# Patient Record
Sex: Male | Born: 1980 | Race: White | Hispanic: No | Marital: Married | Smoking: Heavy tobacco smoker
Health system: Southern US, Community
[De-identification: ages and names within clinical notes are randomized; demographics above are authoritative.]

## PROBLEM LIST (undated history)

## (undated) HISTORY — PX: WISDOM TOOTH EXTRACTION: SHX21

---

## 2003-01-11 ENCOUNTER — Emergency Department (HOSPITAL_COMMUNITY): Admission: EM | Admit: 2003-01-11 | Discharge: 2003-01-11 | Payer: Self-pay | Admitting: Emergency Medicine

## 2007-05-31 ENCOUNTER — Emergency Department (HOSPITAL_COMMUNITY): Admission: EM | Admit: 2007-05-31 | Discharge: 2007-05-31 | Payer: Self-pay | Admitting: Emergency Medicine

## 2018-02-05 DIAGNOSIS — R0683 Snoring: Secondary | ICD-10-CM | POA: Diagnosis not present

## 2018-02-05 DIAGNOSIS — Z1389 Encounter for screening for other disorder: Secondary | ICD-10-CM | POA: Diagnosis not present

## 2018-02-05 DIAGNOSIS — Z6821 Body mass index (BMI) 21.0-21.9, adult: Secondary | ICD-10-CM | POA: Diagnosis not present

## 2018-02-14 DIAGNOSIS — R0683 Snoring: Secondary | ICD-10-CM | POA: Diagnosis not present

## 2018-03-02 DIAGNOSIS — G4730 Sleep apnea, unspecified: Secondary | ICD-10-CM | POA: Diagnosis not present

## 2018-03-12 DIAGNOSIS — G4733 Obstructive sleep apnea (adult) (pediatric): Secondary | ICD-10-CM | POA: Diagnosis not present

## 2018-03-21 ENCOUNTER — Encounter (HOSPITAL_COMMUNITY): Payer: Self-pay | Admitting: Emergency Medicine

## 2018-03-21 ENCOUNTER — Emergency Department (HOSPITAL_COMMUNITY)
Admission: EM | Admit: 2018-03-21 | Discharge: 2018-03-21 | Disposition: A | Discharge status: Home / Self Care | Payer: BLUE CROSS/BLUE SHIELD | Attending: Emergency Medicine | Admitting: Emergency Medicine

## 2018-03-21 ENCOUNTER — Other Ambulatory Visit: Payer: Self-pay

## 2018-03-21 DIAGNOSIS — F17200 Nicotine dependence, unspecified, uncomplicated: Secondary | ICD-10-CM | POA: Diagnosis not present

## 2018-03-21 DIAGNOSIS — R112 Nausea with vomiting, unspecified: Secondary | ICD-10-CM | POA: Diagnosis not present

## 2018-03-21 NOTE — Discharge Instructions (Signed)
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  Current symptoms not consistent with any concern for coronavirus.  Patient without any symptoms consistent with an upper respiratory illness.
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
## 2018-03-21 NOTE — ED Provider Notes (Signed)
[facility] EMERGENCY DEPARTMENT Provider Note   CSN: [id number] Arrival date & time: 03/21/18  1420    History   Chief Complaint Chief Complaint  Patient presents with  . Emesis    HPI Cole Carter is a 38 y.o. male.     Patient works the overnight shift at [employer].  He states he got overheated had episode of vomiting.  Did continue after he left work last episode of vomiting was at 12 noon today.  Patient states he feels fatigued.  Had no diarrhea.  Patient has no fever or any upper respiratory symptoms.  Patient with no known exposure to coronavirus.  Patient without any travel to outside [place] or to any states within the night states recently with increased activity of coronavirus.     History reviewed. No pertinent past medical history.  There are no active problems to display for this patient.   History reviewed. No pertinent surgical history.      Home Medications    Prior to Admission medications   Not on File    Family History No family history on file.  Social History Social History   Tobacco Use  . Smoking status: Light Tobacco Smoker  . Smokeless tobacco: Never Used  Substance Use Topics  . Alcohol use: Yes    Comment: socially   . Drug use: Never     Allergies   Cephalexin   Review of Systems Review of Systems  Constitutional: Positive for fatigue. Negative for chills and fever.  HENT: Negative for congestion, rhinorrhea and sore throat.   Eyes: Negative for visual disturbance.  Respiratory: Negative for cough and shortness of breath.   Cardiovascular: Negative for chest pain and leg swelling.  Gastrointestinal: Positive for nausea and vomiting. Negative for abdominal pain and diarrhea.  Genitourinary: Negative for dysuria.  Musculoskeletal: Negative for back pain and neck pain.  Skin: Negative for rash.  Neurological: Negative for dizziness, light-headedness and headaches.  Hematological: Does not bruise/bleed  easily.  Psychiatric/Behavioral: Negative for confusion.     Physical Exam Updated Vital Signs BP (!) 139/93 (BP Location: Right Arm)   Pulse 80   Temp 98.3 F (36.8 C) (Oral)   Resp 18   Ht 1.93 m (6\' 4" )   Wt 88 kg   SpO2 98%   BMI 23.61 kg/m   Physical Exam Vitals signs and nursing note reviewed.  Constitutional:      Appearance: He is well-developed.  HENT:     Head: Normocephalic and atraumatic.     Nose: No congestion.     Mouth/Throat:     Mouth: Mucous membranes are dry.     Pharynx: No oropharyngeal exudate or posterior oropharyngeal erythema.     Comments: Because membranes slightly dry Eyes:     Extraocular Movements: Extraocular movements intact.     Conjunctiva/sclera: Conjunctivae normal.     Pupils: Pupils are equal, round, and reactive to light.  Neck:     Musculoskeletal: Normal range of motion and neck supple. No neck rigidity.  Cardiovascular:     Rate and Rhythm: Normal rate and regular rhythm.     Heart sounds: Normal heart sounds. No murmur.  Pulmonary:     Effort: Pulmonary effort is normal. No respiratory distress.     Breath sounds: Normal breath sounds.  Abdominal:     General: Abdomen is flat. Bowel sounds are normal.     Palpations: Abdomen is soft.     Tenderness: There is no abdominal  tenderness.  Musculoskeletal: Normal range of motion.  Skin:    General: Skin is warm and dry.     Findings: No rash.  Neurological:     General: No focal deficit present.     Mental Status: He is alert and oriented to person, place, and time.      ED Treatments / Results  Labs (all labs ordered are listed, but only abnormal results are displayed) Labs Reviewed - No data to display  EKG None  Radiology No results found.  Procedures Procedures (including critical care time)  Medications Ordered in ED Medications - No data to display   Initial Impression / Assessment and Plan / ED Course  I have reviewed the triage vital signs and the  nursing notes.  Pertinent labs & imaging results that were available during my care of the patient were reviewed by me and considered in my medical decision making (see chart for details).        Patient was at work overnight yesterday and today.  Feels he got overheated in a hot room and had vomiting.  Is gone home last episode of vomiting was around 12 noon today.  Patient still feels somewhat fatigued.  No diarrhea no upper respiratory symptoms at all nothing really consistent with onset of coronavirus.  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  If feeling well can return to work tomorrow.  Not clear from discussing with the patient but it may be possible that patient may need to be evaluated tomorrow to have a note to actually return to work.  Cannot provide him that at this time since he does not feel well enough to return to work tonight.  Patient works the overnight shift.  Final Clinical Impressions(s) / ED Diagnoses   Final diagnoses:  Non-intractable vomiting with nausea, unspecified vomiting type    ED Discharge Orders    None       Vanetta Mulders, MD 03/21/18 [phone]

## 2018-06-13 ENCOUNTER — Other Ambulatory Visit: Payer: Self-pay

## 2018-06-13 ENCOUNTER — Other Ambulatory Visit (HOSPITAL_COMMUNITY): Payer: Self-pay | Admitting: Physician Assistant

## 2018-06-13 ENCOUNTER — Ambulatory Visit (HOSPITAL_COMMUNITY)
Admission: RE | Admit: 2018-06-13 | Discharge: 2018-06-13 | Disposition: A | Discharge status: Home / Self Care | Payer: BC Managed Care – PPO | Source: Ambulatory Visit | Attending: Physician Assistant | Admitting: Physician Assistant

## 2018-06-13 DIAGNOSIS — K2970 Gastritis, unspecified, without bleeding: Secondary | ICD-10-CM | POA: Insufficient documentation

## 2018-06-13 DIAGNOSIS — R0602 Shortness of breath: Secondary | ICD-10-CM | POA: Diagnosis not present

## 2018-06-13 DIAGNOSIS — R1310 Dysphagia, unspecified: Secondary | ICD-10-CM | POA: Diagnosis not present

## 2018-06-13 DIAGNOSIS — Z1389 Encounter for screening for other disorder: Secondary | ICD-10-CM | POA: Diagnosis not present

## 2018-06-13 DIAGNOSIS — K209 Esophagitis, unspecified without bleeding: Secondary | ICD-10-CM

## 2018-06-13 DIAGNOSIS — R12 Heartburn: Secondary | ICD-10-CM

## 2018-06-13 DIAGNOSIS — Z6821 Body mass index (BMI) 21.0-21.9, adult: Secondary | ICD-10-CM | POA: Diagnosis not present

## 2018-06-13 DIAGNOSIS — R079 Chest pain, unspecified: Secondary | ICD-10-CM | POA: Diagnosis not present

## 2018-06-14 ENCOUNTER — Ambulatory Visit: Payer: Self-pay | Admitting: Gastroenterology

## 2018-06-14 ENCOUNTER — Encounter: Payer: Self-pay | Admitting: Gastroenterology

## 2018-06-18 ENCOUNTER — Other Ambulatory Visit: Payer: Self-pay | Admitting: Physician Assistant

## 2018-06-18 DIAGNOSIS — R7989 Other specified abnormal findings of blood chemistry: Secondary | ICD-10-CM

## 2018-06-26 ENCOUNTER — Ambulatory Visit (HOSPITAL_COMMUNITY)
Admission: RE | Admit: 2018-06-26 | Discharge: 2018-06-26 | Disposition: A | Discharge status: Home / Self Care | Payer: BC Managed Care – PPO | Source: Ambulatory Visit | Attending: Physician Assistant | Admitting: Physician Assistant

## 2018-06-26 ENCOUNTER — Other Ambulatory Visit (HOSPITAL_COMMUNITY): Payer: Self-pay | Admitting: Physician Assistant

## 2018-06-26 ENCOUNTER — Other Ambulatory Visit: Payer: Self-pay

## 2018-06-26 DIAGNOSIS — R7989 Other specified abnormal findings of blood chemistry: Secondary | ICD-10-CM | POA: Diagnosis not present

## 2018-06-26 LAB — POCT I-STAT CREATININE: Creatinine, Ser: 1.2 mg/dL (ref 0.61–1.24)

## 2018-06-26 MED ORDER — IOHEXOL 350 MG/ML SOLN
100.0000 mL | Freq: Once | INTRAVENOUS | Status: AC | PRN
  Administered 2018-06-26: 19:00:00 100 mL via INTRAVENOUS

## 2018-06-29 ENCOUNTER — Ambulatory Visit (HOSPITAL_COMMUNITY): Payer: Self-pay

## 2018-07-09 ENCOUNTER — Other Ambulatory Visit: Payer: Self-pay

## 2018-07-09 ENCOUNTER — Other Ambulatory Visit: Payer: BC Managed Care – PPO

## 2018-07-09 DIAGNOSIS — Z20822 Contact with and (suspected) exposure to covid-19: Secondary | ICD-10-CM

## 2018-07-14 LAB — NOVEL CORONAVIRUS, NAA: SARS-CoV-2, NAA: NOT DETECTED

## 2018-07-24 DIAGNOSIS — R070 Pain in throat: Secondary | ICD-10-CM | POA: Diagnosis not present

## 2018-07-24 DIAGNOSIS — J029 Acute pharyngitis, unspecified: Secondary | ICD-10-CM | POA: Diagnosis not present

## 2018-07-24 DIAGNOSIS — Z6821 Body mass index (BMI) 21.0-21.9, adult: Secondary | ICD-10-CM | POA: Diagnosis not present

## 2018-07-24 DIAGNOSIS — Z1389 Encounter for screening for other disorder: Secondary | ICD-10-CM | POA: Diagnosis not present

## 2018-08-01 ENCOUNTER — Other Ambulatory Visit: Payer: Self-pay

## 2018-08-01 ENCOUNTER — Other Ambulatory Visit: Payer: BC Managed Care – PPO

## 2018-08-01 DIAGNOSIS — Z20822 Contact with and (suspected) exposure to covid-19: Secondary | ICD-10-CM

## 2018-08-01 DIAGNOSIS — Z2089 Contact with and (suspected) exposure to other communicable diseases: Secondary | ICD-10-CM | POA: Diagnosis not present

## 2018-08-01 DIAGNOSIS — Z6821 Body mass index (BMI) 21.0-21.9, adult: Secondary | ICD-10-CM | POA: Diagnosis not present

## 2018-08-01 DIAGNOSIS — R509 Fever, unspecified: Secondary | ICD-10-CM | POA: Diagnosis not present

## 2018-08-02 LAB — NOVEL CORONAVIRUS, NAA: SARS-CoV-2, NAA: NOT DETECTED

## 2018-08-03 ENCOUNTER — Telehealth: Payer: Self-pay | Admitting: General Practice

## 2018-08-03 NOTE — Telephone Encounter (Signed)
Pt made aware of negative results 08/03/2018, faxed copy to employer  Fax: [phone] to the attention of Gailen Shelter

## 2018-08-03 NOTE — Telephone Encounter (Signed)
Fax confirmation received. 

## 2018-08-27 ENCOUNTER — Other Ambulatory Visit: Payer: Self-pay

## 2018-08-27 DIAGNOSIS — L409 Psoriasis, unspecified: Secondary | ICD-10-CM | POA: Diagnosis not present

## 2018-08-27 DIAGNOSIS — Z20822 Contact with and (suspected) exposure to covid-19: Secondary | ICD-10-CM

## 2018-08-27 DIAGNOSIS — Z139 Encounter for screening, unspecified: Secondary | ICD-10-CM | POA: Diagnosis not present

## 2018-08-28 LAB — NOVEL CORONAVIRUS, NAA: SARS-CoV-2, NAA: NOT DETECTED

## 2018-10-31 ENCOUNTER — Other Ambulatory Visit: Payer: Self-pay | Admitting: *Deleted

## 2018-10-31 DIAGNOSIS — Z20822 Contact with and (suspected) exposure to covid-19: Secondary | ICD-10-CM

## 2018-11-01 LAB — NOVEL CORONAVIRUS, NAA: SARS-CoV-2, NAA: NOT DETECTED

## 2018-11-02 ENCOUNTER — Telehealth: Payer: Self-pay | Admitting: Physician Assistant

## 2018-11-02 NOTE — Telephone Encounter (Signed)
Negative COVID results given. Patient results "NOT Detected." Caller expressed understanding. ° °

## 2019-04-03 ENCOUNTER — Ambulatory Visit (HOSPITAL_COMMUNITY)
Admission: EM | Admit: 2019-04-03 | Discharge: 2019-04-03 | Payer: Self-pay | Attending: Plastic Surgery | Admitting: Plastic Surgery

## 2019-04-03 ENCOUNTER — Emergency Department (HOSPITAL_COMMUNITY): Payer: Self-pay | Admitting: Certified Registered Nurse Anesthetist

## 2019-04-03 ENCOUNTER — Encounter (HOSPITAL_COMMUNITY): Admission: EM | Payer: Self-pay | Source: Home / Self Care | Attending: Emergency Medicine

## 2019-04-03 ENCOUNTER — Emergency Department (HOSPITAL_COMMUNITY): Payer: Self-pay

## 2019-04-03 ENCOUNTER — Other Ambulatory Visit: Payer: Self-pay

## 2019-04-03 ENCOUNTER — Encounter (HOSPITAL_COMMUNITY): Payer: Self-pay

## 2019-04-03 DIAGNOSIS — W3400XA Accidental discharge from unspecified firearms or gun, initial encounter: Secondary | ICD-10-CM

## 2019-04-03 DIAGNOSIS — S68110A Complete traumatic metacarpophalangeal amputation of right index finger, initial encounter: Secondary | ICD-10-CM

## 2019-04-03 DIAGNOSIS — S68620A Partial traumatic transphalangeal amputation of right index finger, initial encounter: Secondary | ICD-10-CM | POA: Insufficient documentation

## 2019-04-03 DIAGNOSIS — F17200 Nicotine dependence, unspecified, uncomplicated: Secondary | ICD-10-CM | POA: Insufficient documentation

## 2019-04-03 DIAGNOSIS — Y939 Activity, unspecified: Secondary | ICD-10-CM | POA: Insufficient documentation

## 2019-04-03 DIAGNOSIS — Z20822 Contact with and (suspected) exposure to covid-19: Secondary | ICD-10-CM | POA: Insufficient documentation

## 2019-04-03 HISTORY — PX: I & D EXTREMITY: SHX5045

## 2019-04-03 HISTORY — PX: DEBRIDEMENT AND CLOSURE WOUND: SHX5614

## 2019-04-03 LAB — CBC WITH DIFFERENTIAL/PLATELET
Abs Immature Granulocytes: 0.02 10*3/uL (ref 0.00–0.07)
Basophils Absolute: 0 10*3/uL (ref 0.0–0.1)
Basophils Relative: 1 %
Eosinophils Absolute: 0 10*3/uL (ref 0.0–0.5)
Eosinophils Relative: 0 %
HCT: 46.9 % (ref 39.0–52.0)
Hemoglobin: 15.5 g/dL (ref 13.0–17.0)
Immature Granulocytes: 0 %
Lymphocytes Relative: 20 %
Lymphs Abs: 1.3 10*3/uL (ref 0.7–4.0)
MCH: 30.6 pg (ref 26.0–34.0)
MCHC: 33 g/dL (ref 30.0–36.0)
MCV: 92.5 fL (ref 80.0–100.0)
Monocytes Absolute: 0.4 10*3/uL (ref 0.1–1.0)
Monocytes Relative: 7 %
Neutro Abs: 4.5 10*3/uL (ref 1.7–7.7)
Neutrophils Relative %: 72 %
Platelets: 226 10*3/uL (ref 150–400)
RBC: 5.07 MIL/uL (ref 4.22–5.81)
RDW: 12.6 % (ref 11.5–15.5)
WBC: 6.2 10*3/uL (ref 4.0–10.5)
nRBC: 0 % (ref 0.0–0.2)

## 2019-04-03 LAB — BASIC METABOLIC PANEL
Anion gap: 11 (ref 5–15)
BUN: 9 mg/dL (ref 6–20)
CO2: 27 mmol/L (ref 22–32)
Calcium: 9.1 mg/dL (ref 8.9–10.3)
Chloride: 105 mmol/L (ref 98–111)
Creatinine, Ser: 1.32 mg/dL — ABNORMAL HIGH (ref 0.61–1.24)
GFR calc Af Amer: >60 mL/min (ref >60)
GFR calc non Af Amer: >60 mL/min (ref >60)
Glucose, Bld: 111 mg/dL — ABNORMAL HIGH (ref 70–99)
Potassium: 4.3 mmol/L (ref 3.5–5.1)
Sodium: 143 mmol/L (ref 135–145)

## 2019-04-03 LAB — RESPIRATORY PANEL BY RT PCR (FLU A&B, COVID)
Influenza A by PCR: NEGATIVE
Influenza B by PCR: NEGATIVE
SARS Coronavirus 2 by RT PCR: NEGATIVE

## 2019-04-03 SURGERY — IRRIGATION AND DEBRIDEMENT EXTREMITY
Anesthesia: General | Site: Hand | Laterality: Right

## 2019-04-03 MED ORDER — ONDANSETRON HCL 4 MG/2ML IJ SOLN: 4.0000 mg | Freq: Once | INTRAMUSCULAR | Status: DC | PRN

## 2019-04-03 MED ORDER — PROPOFOL 10 MG/ML IV BOLUS
INTRAVENOUS | Status: DC | PRN
  Administered 2019-04-03: 100 mg via INTRAVENOUS

## 2019-04-03 MED ORDER — LIDOCAINE-EPINEPHRINE 1 %-1:100000 IJ SOLN
INTRAMUSCULAR | Status: AC
  Filled 2019-04-03: qty 1

## 2019-04-03 MED ORDER — OXYCODONE HCL 5 MG/5ML PO SOLN: 5.0000 mg | Freq: Once | ORAL | Status: DC | PRN

## 2019-04-03 MED ORDER — PHENYLEPHRINE 40 MCG/ML (10ML) SYRINGE FOR IV PUSH (FOR BLOOD PRESSURE SUPPORT)
PREFILLED_SYRINGE | INTRAVENOUS | Status: AC
  Filled 2019-04-03: qty 10

## 2019-04-03 MED ORDER — CEFAZOLIN SODIUM-DEXTROSE 2-4 GM/100ML-% IV SOLN
2.0000 g | INTRAVENOUS | Status: AC
  Administered 2019-04-03: 2 g via INTRAVENOUS
  Filled 2019-04-03: qty 100

## 2019-04-03 MED ORDER — LIDOCAINE 2% (20 MG/ML) 5 ML SYRINGE
INTRAMUSCULAR | Status: AC
  Filled 2019-04-03: qty 10

## 2019-04-03 MED ORDER — HYDROCODONE-ACETAMINOPHEN 5-325 MG PO TABS: 1.0000 | ORAL_TABLET | Freq: Four times a day (QID) | ORAL | 0 refills | Status: AC | PRN

## 2019-04-03 MED ORDER — FENTANYL CITRATE (PF) 250 MCG/5ML IJ SOLN
INTRAMUSCULAR | Status: AC
  Filled 2019-04-03: qty 5

## 2019-04-03 MED ORDER — LACTATED RINGERS IV SOLN: INTRAVENOUS | Status: DC

## 2019-04-03 MED ORDER — OXYCODONE HCL 5 MG PO TABS: 5.0000 mg | ORAL_TABLET | Freq: Once | ORAL | Status: DC | PRN

## 2019-04-03 MED ORDER — BUPIVACAINE HCL (PF) 0.5 % IJ SOLN
10.0000 mL | Freq: Once | INTRAMUSCULAR | Status: AC
  Administered 2019-04-03: 10 mL
  Filled 2019-04-03: qty 10

## 2019-04-03 MED ORDER — DEXAMETHASONE SODIUM PHOSPHATE 10 MG/ML IJ SOLN
INTRAMUSCULAR | Status: AC
  Filled 2019-04-03: qty 2

## 2019-04-03 MED ORDER — PHENYLEPHRINE 40 MCG/ML (10ML) SYRINGE FOR IV PUSH (FOR BLOOD PRESSURE SUPPORT)
PREFILLED_SYRINGE | INTRAVENOUS | Status: DC | PRN
  Administered 2019-04-03: 80 ug via INTRAVENOUS

## 2019-04-03 MED ORDER — PROPOFOL 10 MG/ML IV BOLUS
INTRAVENOUS | Status: AC
  Filled 2019-04-03: qty 20

## 2019-04-03 MED ORDER — FENTANYL CITRATE (PF) 100 MCG/2ML IJ SOLN: 25.0000 ug | INTRAMUSCULAR | Status: DC | PRN

## 2019-04-03 MED ORDER — CHLORHEXIDINE GLUCONATE 4 % EX LIQD
60.0000 mL | Freq: Once | CUTANEOUS | Status: DC
  Filled 2019-04-03: qty 60

## 2019-04-03 MED ORDER — MIDAZOLAM HCL 2 MG/2ML IJ SOLN
INTRAMUSCULAR | Status: DC | PRN
  Administered 2019-04-03: 2 mg via INTRAVENOUS

## 2019-04-03 MED ORDER — DEXMEDETOMIDINE HCL IN NACL 200 MCG/50ML IV SOLN
INTRAVENOUS | Status: DC | PRN
  Administered 2019-04-03 (×2): 20 ug via INTRAVENOUS

## 2019-04-03 MED ORDER — MIDAZOLAM HCL 2 MG/2ML IJ SOLN
INTRAMUSCULAR | Status: AC
  Filled 2019-04-03: qty 2

## 2019-04-03 MED ORDER — ACETAMINOPHEN 160 MG/5ML PO SOLN: 325.0000 mg | ORAL | Status: DC | PRN

## 2019-04-03 MED ORDER — ACETAMINOPHEN 325 MG PO TABS: 325.0000 mg | ORAL_TABLET | ORAL | Status: DC | PRN

## 2019-04-03 MED ORDER — LIDOCAINE 2% (20 MG/ML) 5 ML SYRINGE
INTRAMUSCULAR | Status: DC | PRN
  Administered 2019-04-03: 100 mg via INTRAVENOUS

## 2019-04-03 MED ORDER — POVIDONE-IODINE 10 % EX SWAB: 2.0000 "application " | Freq: Once | CUTANEOUS | Status: DC

## 2019-04-03 MED ORDER — LIDOCAINE-EPINEPHRINE 1 %-1:100000 IJ SOLN
INTRAMUSCULAR | Status: DC | PRN
  Administered 2019-04-03: 10 mL

## 2019-04-03 MED ORDER — KETOROLAC TROMETHAMINE 30 MG/ML IJ SOLN: 30.0000 mg | Freq: Once | INTRAMUSCULAR | Status: DC | PRN

## 2019-04-03 MED ORDER — FENTANYL CITRATE (PF) 250 MCG/5ML IJ SOLN
INTRAMUSCULAR | Status: DC | PRN
  Administered 2019-04-03 (×2): 50 ug via INTRAVENOUS
  Administered 2019-04-03: 100 ug via INTRAVENOUS

## 2019-04-03 MED ORDER — MEPERIDINE HCL 25 MG/ML IJ SOLN: 6.2500 mg | INTRAMUSCULAR | Status: DC | PRN

## 2019-04-03 MED ORDER — ONDANSETRON HCL 4 MG/2ML IJ SOLN
INTRAMUSCULAR | Status: DC | PRN
  Administered 2019-04-03: 4 mg via INTRAVENOUS

## 2019-04-03 MED ORDER — DEXAMETHASONE SODIUM PHOSPHATE 10 MG/ML IJ SOLN
INTRAMUSCULAR | Status: DC | PRN
  Administered 2019-04-03: 5 mg via INTRAVENOUS

## 2019-04-03 MED ORDER — SODIUM CHLORIDE 0.9 % IR SOLN
Status: DC | PRN
  Administered 2019-04-03: 3000 mL

## 2019-04-03 MED ORDER — SUCCINYLCHOLINE CHLORIDE 200 MG/10ML IV SOSY
PREFILLED_SYRINGE | INTRAVENOUS | Status: AC
  Filled 2019-04-03: qty 10

## 2019-04-03 MED ORDER — BUPIVACAINE HCL (PF) 0.25 % IJ SOLN
INTRAMUSCULAR | Status: AC
  Filled 2019-04-03: qty 30

## 2019-04-03 MED ORDER — ROCURONIUM BROMIDE 10 MG/ML (PF) SYRINGE
PREFILLED_SYRINGE | INTRAVENOUS | Status: AC
  Filled 2019-04-03: qty 10

## 2019-04-03 MED ORDER — ONDANSETRON HCL 4 MG/2ML IJ SOLN
INTRAMUSCULAR | Status: AC
  Filled 2019-04-03: qty 4

## 2019-04-03 MED ORDER — CEFAZOLIN SODIUM-DEXTROSE 2-4 GM/100ML-% IV SOLN
2.0000 g | Freq: Once | INTRAVENOUS | Status: AC
  Administered 2019-04-03: 2 g via INTRAVENOUS
  Filled 2019-04-03: qty 100

## 2019-04-03 SURGICAL SUPPLY — 41 items
BNDG COHESIVE 1X5 TAN STRL LF (GAUZE/BANDAGES/DRESSINGS) ×3 IMPLANT
BNDG CONFORM 2 STRL LF (GAUZE/BANDAGES/DRESSINGS) IMPLANT
BNDG ELASTIC 4X5.8 VLCR STR LF (GAUZE/BANDAGES/DRESSINGS) ×3 IMPLANT
BNDG GAUZE ELAST 4 BULKY (GAUZE/BANDAGES/DRESSINGS) IMPLANT
CORD BIPOLAR FORCEPS 12FT (ELECTRODE) IMPLANT
COVER SURGICAL LIGHT HANDLE (MISCELLANEOUS) ×3 IMPLANT
COVER WAND RF STERILE (DRAPES) ×3 IMPLANT
CUFF TOURN SGL QUICK 18X4 (TOURNIQUET CUFF) IMPLANT
CUFF TOURN SGL QUICK 24 (TOURNIQUET CUFF)
CUFF TRNQT CYL 24X4X16.5-23 (TOURNIQUET CUFF) IMPLANT
DRAPE INCISE IOBAN 66X45 STRL (DRAPES) IMPLANT
DRSG ADAPTIC 3X8 NADH LF (GAUZE/BANDAGES/DRESSINGS) IMPLANT
GAUZE SPONGE 4X4 12PLY STRL (GAUZE/BANDAGES/DRESSINGS) ×3 IMPLANT
GAUZE XEROFORM 1X8 LF (GAUZE/BANDAGES/DRESSINGS) ×3 IMPLANT
GLOVE BIO SURGEON STRL SZ8 (GLOVE) ×3 IMPLANT
GLOVE BIOGEL M STRL SZ7.5 (GLOVE) ×3 IMPLANT
GOWN STRL REUS W/ TWL LRG LVL3 (GOWN DISPOSABLE) ×4 IMPLANT
GOWN STRL REUS W/TWL LRG LVL3 (GOWN DISPOSABLE) ×6
KIT BASIN OR (CUSTOM PROCEDURE TRAY) ×3 IMPLANT
KIT TURNOVER KIT B (KITS) ×3 IMPLANT
MANIFOLD NEPTUNE II (INSTRUMENTS) ×3 IMPLANT
NEEDLE HYPO 25GX1X1/2 BEV (NEEDLE) ×3 IMPLANT
NS IRRIG 1000ML POUR BTL (IV SOLUTION) ×3 IMPLANT
PACK ORTHO EXTREMITY (CUSTOM PROCEDURE TRAY) ×3 IMPLANT
PAD ABD 8X10 STRL (GAUZE/BANDAGES/DRESSINGS) IMPLANT
PAD ARMBOARD 7.5X6 YLW CONV (MISCELLANEOUS) ×3 IMPLANT
PAD CAST 4YDX4 CTTN HI CHSV (CAST SUPPLIES) IMPLANT
PADDING CAST COTTON 4X4 STRL (CAST SUPPLIES)
SET CYSTO W/LG BORE CLAMP LF (SET/KITS/TRAYS/PACK) IMPLANT
SOL PREP POV-IOD 4OZ 10% (MISCELLANEOUS) ×6 IMPLANT
SPONGE LAP 4X18 RFD (DISPOSABLE) ×3 IMPLANT
STAPLER VISISTAT 35W (STAPLE) IMPLANT
SUT CHROMIC 4 0 PS 2 18 (SUTURE) ×3 IMPLANT
SUT VIC AB 4-0 PS2 18 (SUTURE) IMPLANT
SWAB CULTURE ESWAB REG 1ML (MISCELLANEOUS) IMPLANT
SYR CONTROL 10ML LL (SYRINGE) ×3 IMPLANT
TOWEL GREEN STERILE (TOWEL DISPOSABLE) ×3 IMPLANT
TOWEL GREEN STERILE FF (TOWEL DISPOSABLE) ×3 IMPLANT
TUBE CONNECTING 12X1/4 (SUCTIONS) ×3 IMPLANT
WATER STERILE IRR 1000ML POUR (IV SOLUTION) ×3 IMPLANT
YANKAUER SUCT BULB TIP NO VENT (SUCTIONS) ×3 IMPLANT
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
## 2019-04-03 NOTE — ED Notes (Signed)
Security at bedside, chaplin present, social work present.

## 2019-04-03 NOTE — Anesthesia Preprocedure Evaluation (Addendum)
Anesthesia Evaluation  Patient identified by MRN, date of birth, ID band Patient awake    Reviewed: Allergy & Precautions, NPO status , Patient's Chart, lab work & pertinent test results  Airway Mallampati: I       Dental no notable dental hx. (+) Teeth Intact   Pulmonary neg pulmonary ROS, Current Smoker and Patient abstained from smoking.,    Pulmonary exam normal breath sounds clear to auscultation       Cardiovascular negative cardio ROS Normal cardiovascular exam Rhythm:Regular Rate:Normal     Neuro/Psych negative neurological ROS  negative psych ROS   GI/Hepatic negative GI ROS, Neg liver ROS,   Endo/Other  negative endocrine ROS  Renal/GU negative Renal ROS  negative genitourinary   Musculoskeletal negative musculoskeletal ROS (+)   Abdominal Normal abdominal exam  (+)   Peds negative pediatric ROS (+)  Hematology negative hematology ROS (+)   Anesthesia Other Findings   Reproductive/Obstetrics negative OB ROS                             Anesthesia Physical Anesthesia Plan  ASA: II  Anesthesia Plan: General   Post-op Pain Management:    Induction: Intravenous  PONV Risk Score and Plan: 1 and Ondansetron  Airway Management Planned: LMA  Additional Equipment: None  Intra-op Plan:   Post-operative Plan: Extubation in OR  Informed Consent: I have reviewed the patients History and Physical, chart, labs and discussed the procedure including the risks, benefits and alternatives for the proposed anesthesia with the patient or authorized representative who has indicated his/her understanding and acceptance.     Dental advisory given  Plan Discussed with: CRNA  Anesthesia Plan Comments:        Anesthesia Quick Evaluation

## 2019-04-03 NOTE — Discharge Instructions (Addendum)
Activity As tolerated:  NO driving No heavy activities No shower for 36 hours, you can shower on Friday 04/05/19 AM.  Diet: Regular  Wound Care: Keep dressing clean & dry Do not change dressings for 2 days, then change daily or as needed, whichever is sooner.  Special Instructions: Call Doctor if any unusual problems occur such as pain, excessive Bleeding, unrelieved Nausea/vomiting, Fever &/or chills  Follow-up appointment: Scheduled for 2 weeks.
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
## 2019-04-03 NOTE — Anesthesia Procedure Notes (Signed)
Procedure Name: LMA Insertion Date/Time: 04/03/2019 4:44 PM Performed by: Drema Pry, CRNA Pre-anesthesia Checklist: Patient identified, Emergency Drugs available, Suction available and Patient being monitored Patient Re-evaluated:Patient Re-evaluated prior to induction Oxygen Delivery Method: Circle System Utilized Preoxygenation: Pre-oxygenation with 100% oxygen Induction Type: IV induction Ventilation: Mask ventilation without difficulty LMA: LMA inserted LMA Size: 5.0 Number of attempts: 1 Placement Confirmation: positive ETCO2 Tube secured with: Tape Dental Injury: Teeth and Oropharynx as per pre-operative assessment
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
## 2019-04-04 ENCOUNTER — Encounter: Payer: Self-pay | Admitting: *Deleted

## 2019-04-04 NOTE — Anesthesia Postprocedure Evaluation (Signed)
Anesthesia Post Note  Patient: Cole Carter [facility]  Procedure(s) Performed: IRRIGATION AND DEBRIDEMENT EXTREMITY OF RIGHT INDEX FINGER AMPUTATION (Right Hand) DEBRIDEMENT AND CLOSURE OF  RIGHT INDEX FINGER AMPUTATION  WOUND (Right Finger)     Patient location during evaluation: PACU Anesthesia Type: General Level of consciousness: awake and alert Pain management: pain level controlled Vital Signs Assessment: post-procedure vital signs reviewed and stable Respiratory status: spontaneous breathing, nonlabored ventilation, respiratory function stable and patient connected to nasal cannula oxygen Cardiovascular status: blood pressure returned to baseline and stable Postop Assessment: no apparent nausea or vomiting Anesthetic complications: no    Last Vitals:  Vitals:   04/03/19 1845 04/03/19 1900  BP: 110/72 126/87  Pulse: 81 95  Resp: 11 13  Temp:  36.7 C  SpO2: 100% 100%    Last Pain:  Vitals:   04/03/19 1900  TempSrc:   PainSc: 0-No pain                 Cole Carter COKER

## 2019-04-10 ENCOUNTER — Telehealth: Payer: Self-pay
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
## 2019-04-10 NOTE — Telephone Encounter (Signed)
Patient called requesting more pain medication. He stated his pain has been constant, but he hit his hand on something and the pain has now increased. His preferred pharmacy is:  [facility] [phone] - [place], [place] - [address]

## 2019-04-10 NOTE — Telephone Encounter (Signed)
Called and spoke with the patient and informed him per Dr. Jackalyn Lombard not be able to refill the pain medication.  We will see how things are going at his follow-up appt next week.  Patient verbalized understanding and agreed, and stated that he will reach out to his PCP.  He stated that he has taken Ibuprofen and it's not helping.//AB/CMA

## 2019-04-11 ENCOUNTER — Ambulatory Visit: Payer: Self-pay | Admitting: Plastic Surgery
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
## 2019-04-17 ENCOUNTER — Ambulatory Visit: Payer: Self-pay | Admitting: Plastic Surgery

## 2019-04-17 ENCOUNTER — Ambulatory Visit (INDEPENDENT_AMBULATORY_CARE_PROVIDER_SITE_OTHER): Payer: Self-pay | Admitting: Plastic Surgery

## 2019-04-17 ENCOUNTER — Other Ambulatory Visit: Payer: Self-pay

## 2019-04-17 ENCOUNTER — Encounter: Payer: Self-pay | Admitting: Plastic Surgery

## 2019-04-17 VITALS — BP 129/82 | HR 98 | Temp 97.7°F | Ht 76.0 in | Wt 181.2 lb

## 2019-04-17 DIAGNOSIS — S68110A Complete traumatic metacarpophalangeal amputation of right index finger, initial encounter: Secondary | ICD-10-CM

## 2019-04-17 DIAGNOSIS — S68119A Complete traumatic metacarpophalangeal amputation of unspecified finger, initial encounter: Secondary | ICD-10-CM | POA: Insufficient documentation

## 2019-04-17 DIAGNOSIS — S68119D Complete traumatic metacarpophalangeal amputation of unspecified finger, subsequent encounter: Secondary | ICD-10-CM

## 2019-04-17 MED ORDER — GABAPENTIN 300 MG PO CAPS: 300.0000 mg | ORAL_CAPSULE | Freq: Three times a day (TID) | ORAL | 0 refills | Status: AC

## 2019-04-26 ENCOUNTER — Encounter: Payer: Self-pay | Admitting: Plastic Surgery

## 2019-04-26 ENCOUNTER — Telehealth: Payer: Self-pay

## 2019-04-26 NOTE — Telephone Encounter (Signed)
Received call from Biagio Borg who stated she is a Designer, jewellery who lives with the patient. She reports he is reporting an adverse reaction after taking gabapentin. She states he has been having blurry vision, as well as an intense jerking with his right hand and arm that is progressively worsening. On his right index finger close to the suture there is increased redness and swelling. He is also complaining of increased tenderness in the right index finger.

## 2019-04-29 ENCOUNTER — Other Ambulatory Visit (INDEPENDENT_AMBULATORY_CARE_PROVIDER_SITE_OTHER): Payer: Self-pay | Admitting: Plastic Surgery

## 2019-04-29 MED ORDER — CELECOXIB 200 MG PO CAPS: 200.0000 mg | ORAL_CAPSULE | Freq: Two times a day (BID) | ORAL | 0 refills | Status: AC

## 2019-04-29 NOTE — Telephone Encounter (Signed)
Spoke with patient.  Stop taking Gabapentin. Sent RX to pharmacy for Celebrex.  Please schedule him to see me this Thursday 4/29 at 8:20 am. Also, please follow up with PT on the referral to make a "cap" for the amputated finger to protect it.  He reports he has not heard from them and has tried calling them.

## 2019-04-29 NOTE — Telephone Encounter (Signed)
A call was made by [facility] to follow-up on referral for cap for the amputated finger to protect it.  They will be give the patient a call regarding an appointment.//AB/CMA
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
## 2019-05-02 ENCOUNTER — Ambulatory Visit: Payer: Self-pay | Admitting: Plastic Surgery

## 2019-05-02 ENCOUNTER — Encounter: Payer: Self-pay | Admitting: Plastic Surgery

## 2019-05-08 ENCOUNTER — Encounter: Payer: Self-pay | Admitting: *Deleted

## 2019-05-08 ENCOUNTER — Ambulatory Visit: Payer: 59 | Attending: Plastic Surgery | Admitting: *Deleted

## 2019-05-08 ENCOUNTER — Other Ambulatory Visit: Payer: Self-pay

## 2019-05-08 DIAGNOSIS — R600 Localized edema: Secondary | ICD-10-CM | POA: Diagnosis present

## 2019-05-08 DIAGNOSIS — M6281 Muscle weakness (generalized): Secondary | ICD-10-CM | POA: Insufficient documentation

## 2019-05-08 DIAGNOSIS — M25641 Stiffness of right hand, not elsewhere classified: Secondary | ICD-10-CM | POA: Diagnosis present

## 2019-05-08 DIAGNOSIS — M79644 Pain in right finger(s): Secondary | ICD-10-CM | POA: Insufficient documentation

## 2019-05-08 DIAGNOSIS — M25541 Pain in joints of right hand: Secondary | ICD-10-CM | POA: Insufficient documentation

## 2019-05-08 DIAGNOSIS — R278 Other lack of coordination: Secondary | ICD-10-CM | POA: Diagnosis present

## 2019-05-08 NOTE — Patient Instructions (Addendum)
WEARING SCHEDULE:  Wear splint at work and for using hand in work related activities, remove for hygiene care.  PURPOSE:  To prevent movement and for protection until injury can heal  CARE OF SPLINT:  Keep splint away from heat sources including: stove, radiator or furnace, or a car in sunlight. The splint can melt and will no longer fit you properly  Keep away from pets and children  Clean the splint with rubbing alcohol 1-2 times per day.  * During this time, make sure you also clean your hand/arm as instructed by your therapist and/or perform dressing changes as needed. Then dry hand/arm completely before replacing splint. (When cleaning hand/arm, keep it immobilized in same position until splint is replaced)  PRECAUTIONS/POTENTIAL PROBLEMS: *If you notice or experience increased pain, swelling, numbness, or a lingering reddened area from the splint: Contact your therapist immediately by calling [phone]. You must wear the splint for protection, but we will get you scheduled for adjustments as quickly as possible.  (If only straps or hooks need to be replaced and NO adjustments to the splint need to be made, just call the office ahead and let them know you are coming in)  If you have any medical concerns or signs of infection, please call your doctor immediately  Do these exercises 4-6 times per day.  PIP Flexion (Active Blocked)    Hold large knuckle straight using other hand. Bend middle joint of index finger as far as possible. Hold _5__ seconds. Repeat __10__ times. Do __4-6__ sessions per day. Activity: Curl fingers around a jar cap.*  Flexor Tendon Gliding (Active Full Fist)    Straighten all fingers, then make a fist, bending all joints. Repeat __10__ times. Do __4-6__ sessions per day.  Copyright  VHI. All rights reserved.  MP Flexion (Active)    With back of hand on table, bend large knuckles as far as they will go, keeping small joints straight. Repeat  __10__ times. Do _4-6___ sessions per day. Activity: Reach into a narrow container.*  Copyright  VHI. All rights reserved.
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
## 2019-05-09 ENCOUNTER — Ambulatory Visit: Payer: Self-pay | Admitting: Plastic Surgery

## 2019-06-04 ENCOUNTER — Other Ambulatory Visit: Payer: Self-pay | Admitting: Radiology

## 2019-06-04 ENCOUNTER — Other Ambulatory Visit: Payer: Self-pay

## 2019-06-04 ENCOUNTER — Other Ambulatory Visit: Payer: Self-pay | Admitting: Occupational Medicine

## 2019-06-04 ENCOUNTER — Ambulatory Visit: Payer: Self-pay

## 2019-06-04 DIAGNOSIS — Z0000 Encounter for general adult medical examination without abnormal findings: Secondary | ICD-10-CM

## 2019-06-05 ENCOUNTER — Ambulatory Visit: Payer: 59 | Attending: Plastic Surgery | Admitting: Occupational Therapy

## 2019-06-07 ENCOUNTER — Ambulatory Visit: Payer: 59 | Admitting: Occupational Therapy

## 2019-06-12 ENCOUNTER — Ambulatory Visit: Payer: 59 | Admitting: Occupational Therapy

## 2019-06-14 ENCOUNTER — Ambulatory Visit: Payer: 59 | Admitting: Occupational Therapy

## 2019-06-18 ENCOUNTER — Ambulatory Visit: Payer: 59 | Admitting: Occupational Therapy

## 2019-06-20 ENCOUNTER — Ambulatory Visit: Payer: 59 | Admitting: Occupational Therapy

## 2019-06-23 ENCOUNTER — Emergency Department (HOSPITAL_COMMUNITY)
Admission: EM | Admit: 2019-06-23 | Discharge: 2019-06-23 | Disposition: A | Discharge status: Home / Self Care | Payer: 59 | Attending: Emergency Medicine | Admitting: Emergency Medicine

## 2019-06-23 ENCOUNTER — Emergency Department (HOSPITAL_COMMUNITY): Payer: 59

## 2019-06-23 ENCOUNTER — Encounter (HOSPITAL_COMMUNITY): Payer: Self-pay | Admitting: Emergency Medicine

## 2019-06-23 ENCOUNTER — Other Ambulatory Visit: Payer: Self-pay

## 2019-06-23 DIAGNOSIS — R110 Nausea: Secondary | ICD-10-CM | POA: Diagnosis not present

## 2019-06-23 DIAGNOSIS — S060X1A Concussion with loss of consciousness of 30 minutes or less, initial encounter: Secondary | ICD-10-CM | POA: Diagnosis not present

## 2019-06-23 DIAGNOSIS — M542 Cervicalgia: Secondary | ICD-10-CM | POA: Diagnosis not present

## 2019-06-23 DIAGNOSIS — M545 Low back pain: Secondary | ICD-10-CM | POA: Diagnosis not present

## 2019-06-23 DIAGNOSIS — M79605 Pain in left leg: Secondary | ICD-10-CM | POA: Insufficient documentation

## 2019-06-23 DIAGNOSIS — R519 Headache, unspecified: Secondary | ICD-10-CM | POA: Diagnosis not present

## 2019-06-23 DIAGNOSIS — Y939 Activity, unspecified: Secondary | ICD-10-CM | POA: Diagnosis not present

## 2019-06-23 DIAGNOSIS — Y929 Unspecified place or not applicable: Secondary | ICD-10-CM | POA: Insufficient documentation

## 2019-06-23 DIAGNOSIS — Z041 Encounter for examination and observation following transport accident: Secondary | ICD-10-CM | POA: Insufficient documentation

## 2019-06-23 DIAGNOSIS — R0781 Pleurodynia: Secondary | ICD-10-CM | POA: Insufficient documentation

## 2019-06-23 DIAGNOSIS — Y999 Unspecified external cause status: Secondary | ICD-10-CM | POA: Insufficient documentation

## 2019-06-23 DIAGNOSIS — F17210 Nicotine dependence, cigarettes, uncomplicated: Secondary | ICD-10-CM | POA: Diagnosis not present

## 2019-06-23 MED ORDER — CYCLOBENZAPRINE HCL 10 MG PO TABS: 10.0000 mg | ORAL_TABLET | Freq: Two times a day (BID) | ORAL | 0 refills | Status: AC | PRN

## 2019-06-23 MED ORDER — OXYCODONE-ACETAMINOPHEN 5-325 MG PO TABS
1.0000 | ORAL_TABLET | Freq: Once | ORAL | Status: AC
  Administered 2019-06-23: 1 via ORAL
  Filled 2019-06-23: qty 1

## 2019-06-23 MED ORDER — METHOCARBAMOL 500 MG PO TABS
500.0000 mg | ORAL_TABLET | Freq: Once | ORAL | Status: AC
  Administered 2019-06-23: 500 mg via ORAL
  Filled 2019-06-23: qty 1
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
## 2019-06-23 NOTE — Discharge Instructions (Addendum)
Please read instructions below. Apply ice to your areas of pain for 20 minutes at a time. You can take 600 mg of Advil/ibuprofen every 6 hours as needed for pain. You can take flexeril every 12 hours as needed for muscle spasm. Be aware this medication can make you drowsy. Schedule an appointment with your primary care provider to follow up on your visit today. Return to the ER for new numbness or weakness in your arms or legs, inability to urinate, inability to hold your bowels, or concerning symptoms.  Concussion treatment and precautions: You can treat your headache with over-the-counter medications such as tylenol as needed. Stay hydrated and get plenty of rest. Limit your screen time and complex thinking. Avoid any contact sports/activities to prevent re-injury to your head. Follow up with your primary care provider in 1 week for re-check and to be cleared to return to normal activity. Return to the ER if you develop severely worsening headache, changes in your vision, persistent vomiting, or new or concerning symptoms.

## 2019-07-24 ENCOUNTER — Encounter (HOSPITAL_COMMUNITY): Payer: Self-pay | Admitting: Emergency Medicine

## 2019-10-16 ENCOUNTER — Ambulatory Visit: Payer: 59 | Admitting: Dermatology

## 2020-12-04 IMAGING — DX DG CHEST 1V
1 series · 1 of 1 positions shown · non-contrast
Comparison: 06/13/2018

CLINICAL DATA: Physical exam

EXAM:
CHEST  1 VIEW

[chest pa]
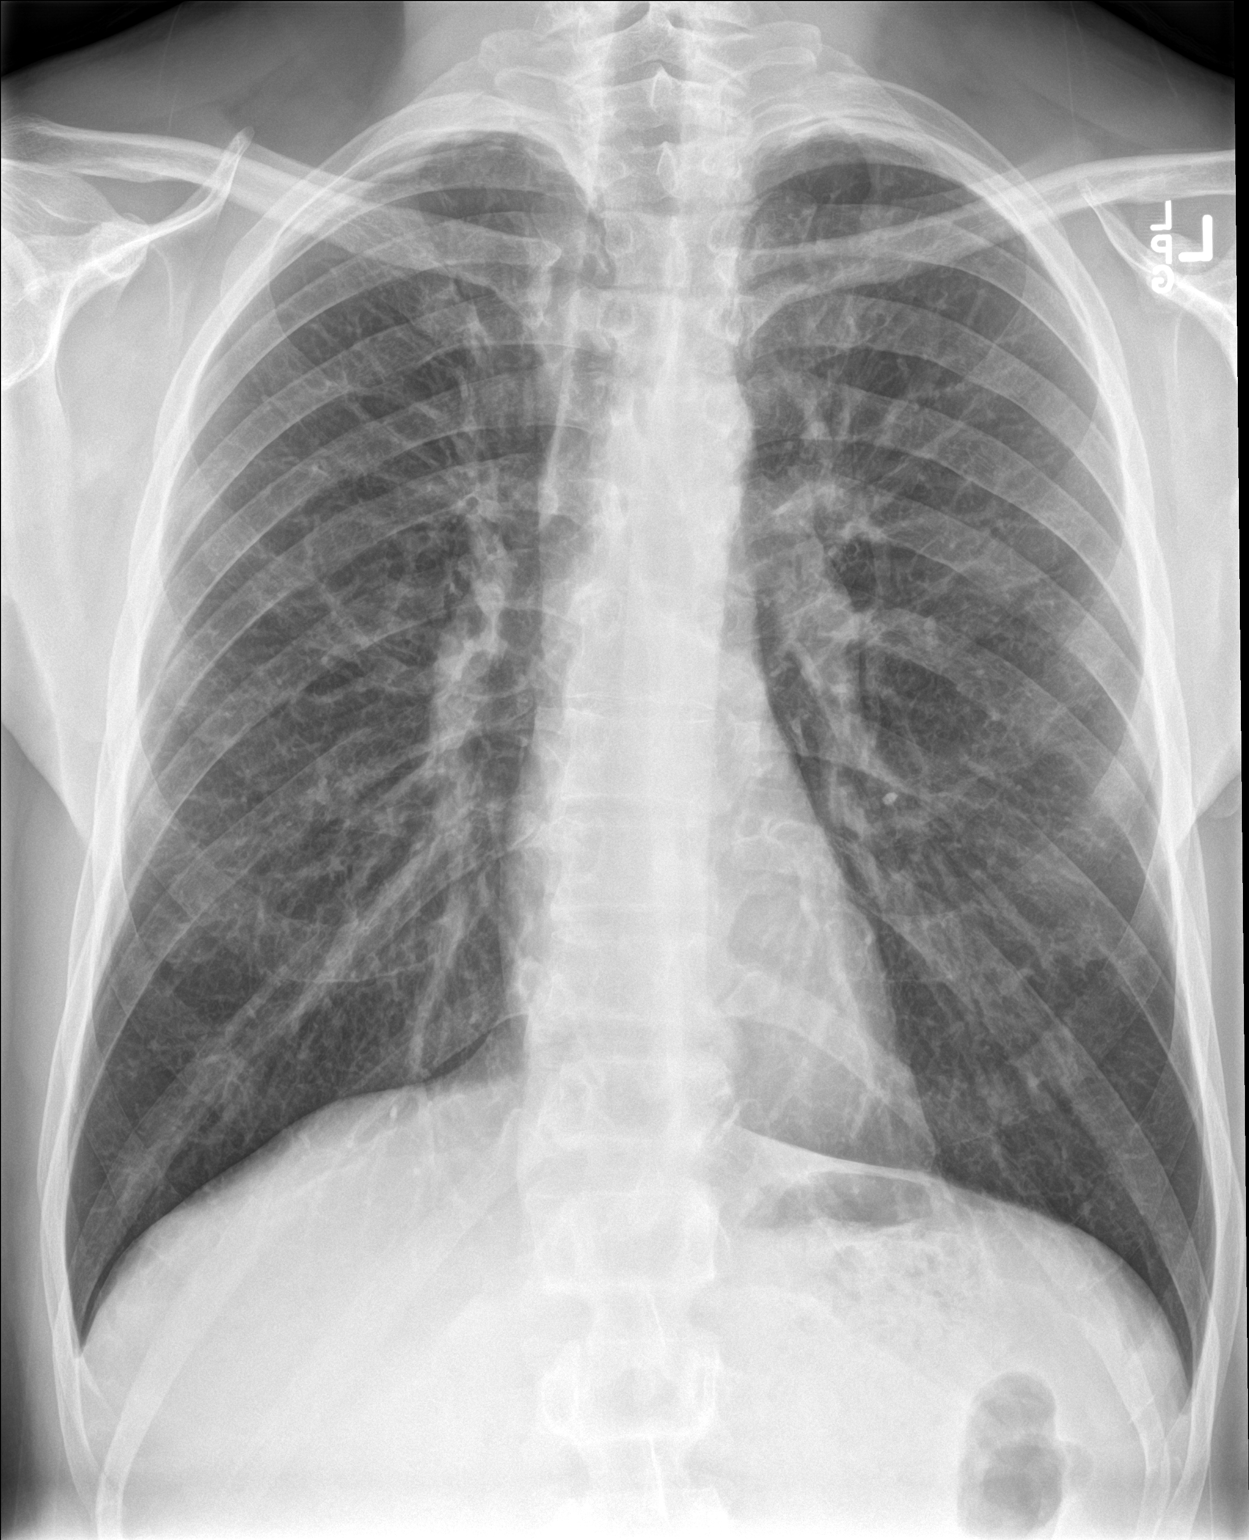

[1 of 1 positions shown; findings below may reference images not displayed]

FINDINGS: The heart size and mediastinal contours are within normal limits.
Both lungs are clear. The visualized skeletal structures are
unremarkable.
IMPRESSION: No active disease.

## 2021-10-25 DIAGNOSIS — L309 Dermatitis, unspecified: Secondary | ICD-10-CM | POA: Diagnosis not present

## 2022-01-12 DIAGNOSIS — E119 Type 2 diabetes mellitus without complications: Secondary | ICD-10-CM | POA: Diagnosis not present

## 2022-01-12 DIAGNOSIS — Z0000 Encounter for general adult medical examination without abnormal findings: Secondary | ICD-10-CM | POA: Diagnosis not present

## 2022-01-12 DIAGNOSIS — L409 Psoriasis, unspecified: Secondary | ICD-10-CM | POA: Diagnosis not present

## 2022-01-12 DIAGNOSIS — Z6823 Body mass index (BMI) 23.0-23.9, adult: Secondary | ICD-10-CM | POA: Diagnosis not present

## 2022-01-12 DIAGNOSIS — Z1331 Encounter for screening for depression: Secondary | ICD-10-CM | POA: Diagnosis not present

## 2022-01-27 DIAGNOSIS — L400 Psoriasis vulgaris: Secondary | ICD-10-CM | POA: Diagnosis not present

## 2022-02-08 DIAGNOSIS — L400 Psoriasis vulgaris: Secondary | ICD-10-CM | POA: Diagnosis not present

## 2022-06-01 DIAGNOSIS — L400 Psoriasis vulgaris: Secondary | ICD-10-CM | POA: Diagnosis not present

## 2022-07-25 DIAGNOSIS — M79671 Pain in right foot: Secondary | ICD-10-CM | POA: Diagnosis not present

## 2022-08-02 DIAGNOSIS — M79671 Pain in right foot: Secondary | ICD-10-CM | POA: Diagnosis not present

## 2022-08-09 DIAGNOSIS — S93601A Unspecified sprain of right foot, initial encounter: Secondary | ICD-10-CM | POA: Diagnosis not present

## 2022-08-09 DIAGNOSIS — Z1331 Encounter for screening for depression: Secondary | ICD-10-CM | POA: Diagnosis not present

## 2023-02-02 DIAGNOSIS — U071 COVID-19: Secondary | ICD-10-CM | POA: Diagnosis not present

## 2023-02-14 DIAGNOSIS — Z7961 Long term (current) use of immunomodulator: Secondary | ICD-10-CM | POA: Diagnosis not present

## 2023-02-14 DIAGNOSIS — L409 Psoriasis, unspecified: Secondary | ICD-10-CM | POA: Diagnosis not present

## 2023-02-14 DIAGNOSIS — Z6824 Body mass index (BMI) 24.0-24.9, adult: Secondary | ICD-10-CM | POA: Diagnosis not present

## 2023-02-14 DIAGNOSIS — Z0000 Encounter for general adult medical examination without abnormal findings: Secondary | ICD-10-CM | POA: Diagnosis not present

## 2023-02-14 DIAGNOSIS — Z1331 Encounter for screening for depression: Secondary | ICD-10-CM | POA: Diagnosis not present

## 2023-02-14 DIAGNOSIS — R7309 Other abnormal glucose: Secondary | ICD-10-CM | POA: Diagnosis not present

## 2023-08-18 DIAGNOSIS — J038 Acute tonsillitis due to other specified organisms: Secondary | ICD-10-CM | POA: Diagnosis not present

## 2023-09-11 DIAGNOSIS — L400 Psoriasis vulgaris: Secondary | ICD-10-CM | POA: Diagnosis not present
